# Patient Record
Sex: Female | Born: 1995 | Race: Black or African American | Hispanic: No | Marital: Single | Smoking: Never smoker
Health system: Southern US, Community
[De-identification: ages and names within clinical notes are randomized; demographics above are authoritative.]

## PROBLEM LIST (undated history)

## (undated) DIAGNOSIS — N809 Endometriosis, unspecified: Secondary | ICD-10-CM

## (undated) DIAGNOSIS — D649 Anemia, unspecified: Secondary | ICD-10-CM

## (undated) HISTORY — PX: OTHER SURGICAL HISTORY: SHX169

## (undated) HISTORY — PX: LAPAROSCOPY: SHX197

---

## 2017-11-11 ENCOUNTER — Encounter (HOSPITAL_COMMUNITY): Payer: Self-pay | Admitting: *Deleted

## 2017-11-11 ENCOUNTER — Other Ambulatory Visit: Payer: Self-pay

## 2017-11-11 ENCOUNTER — Inpatient Hospital Stay (HOSPITAL_COMMUNITY)
Admission: AD | Admit: 2017-11-11 | Discharge: 2017-11-11 | Disposition: A | Discharge status: Home / Self Care | Payer: Self-pay | Source: Ambulatory Visit | Attending: Obstetrics & Gynecology | Admitting: Obstetrics & Gynecology

## 2017-11-11 DIAGNOSIS — R102 Pelvic and perineal pain: Secondary | ICD-10-CM | POA: Insufficient documentation

## 2017-11-11 DIAGNOSIS — G8929 Other chronic pain: Secondary | ICD-10-CM | POA: Insufficient documentation

## 2017-11-11 HISTORY — DX: Anemia, unspecified: D64.9

## 2017-11-11 HISTORY — DX: Endometriosis, unspecified: N80.9

## 2017-11-11 LAB — CBC WITH DIFFERENTIAL/PLATELET
BASOS PCT: 1 %
Basophils Absolute: 0.1 10*3/uL (ref 0.0–0.1)
Eosinophils Absolute: 0.1 10*3/uL (ref 0.0–0.7)
Eosinophils Relative: 3 %
HEMATOCRIT: 34.4 % — AB (ref 36.0–46.0)
Hemoglobin: 11.7 g/dL — ABNORMAL LOW (ref 12.0–15.0)
Lymphocytes Relative: 42 %
Lymphs Abs: 2 10*3/uL (ref 0.7–4.0)
MCH: 27.7 pg (ref 26.0–34.0)
MCHC: 34 g/dL (ref 30.0–36.0)
MCV: 81.5 fL (ref 78.0–100.0)
MONO ABS: 0.2 10*3/uL (ref 0.1–1.0)
MONOS PCT: 5 %
Neutro Abs: 2.3 10*3/uL (ref 1.7–7.7)
Neutrophils Relative %: 49 %
Platelets: 194 10*3/uL (ref 150–400)
RBC: 4.22 MIL/uL (ref 3.87–5.11)
RDW: 14.1 % (ref 11.5–15.5)
WBC: 4.7 10*3/uL (ref 4.0–10.5)

## 2017-11-11 LAB — URINALYSIS, ROUTINE W REFLEX MICROSCOPIC
BILIRUBIN URINE: NEGATIVE
Bacteria, UA: NONE SEEN
GLUCOSE, UA: NEGATIVE mg/dL
KETONES UR: NEGATIVE mg/dL
Leukocytes, UA: NEGATIVE
NITRITE: NEGATIVE
PROTEIN: NEGATIVE mg/dL
Specific Gravity, Urine: 1.031 — ABNORMAL HIGH (ref 1.005–1.030)
pH: 5 (ref 5.0–8.0)

## 2017-11-11 LAB — POCT PREGNANCY, URINE: Preg Test, Ur: NEGATIVE

## 2017-11-11 MED ORDER — KETOROLAC TROMETHAMINE 60 MG/2ML IM SOLN
60.0000 mg | Freq: Once | INTRAMUSCULAR | Status: AC
  Administered 2017-11-11: 60 mg via INTRAMUSCULAR
  Filled 2017-11-11: qty 2

## 2017-11-11 NOTE — MAU Note (Signed)
Pain started last night in RLQ, sharp stabbing pains.  When woke up this morning, pain was on both rt and left side, left worse.  Had diagnostic lap last Dec, dx with endometriosis, ovarian cysts and pelvic congestion

## 2017-11-11 NOTE — MAU Provider Note (Signed)
History     CSN: [id number]  Arrival date and time: 11/11/17 4098   First Provider Initiated Contact with Patient 11/11/17 0902      Chief Complaint  Patient presents with  . Abdominal Pain   HPI [name] is a 22 y.o. G2P1011 who presents to MAU today with complaint of pelvic pain. The patient states a long history of pelvic pain following the birth of her first child. 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 She has also had multiple ovarian cysts rupture and cause her periodic pain. She feels that she may have had a cyst rupture last night causing this increase in pain today. She states LLQ pain is worse than RLQ. She states LMP in early August and a long history of irregular periods. She is not currently on birth control. She has tried Nexplanon and OCPs in the past without relief of her symptoms and states her last doctor took her off birth control to see if pain would improve. She is sexually active. She has been taking Tylenol, last dose 0700 today, and Ibuprofen, last dose yesterday, for pain with some relief. She denies N/V/D or constipation, fever, vaginal bleeding, discharge, or UTI symptoms. She rates pain at 6/10 currently.   OB History    Gravida  2   Para  1   Term  1   Preterm      AB  1   Living  1     SAB  1   TAB      Ectopic      Multiple      Live Births  1           Past Medical History:  Diagnosis Date  . Anemia   . Endometriosis     Past Surgical History:  Procedure Laterality Date  . LAPAROSCOPY    . left knee surgery      History reviewed. No pertinent family history.  Social History   Tobacco Use  . Smoking status: Never Smoker  . Smokeless tobacco: Never Used  Substance Use Topics  . Alcohol use: Never    Frequency: Never  . Drug use: Never    Allergies: No Known Allergies  No  medications prior to admission.    Review of Systems  Constitutional: Negative for fever.  Gastrointestinal: Negative for abdominal pain, constipation, diarrhea, nausea and vomiting.  Genitourinary: Positive for pelvic pain. Negative for dysuria, frequency, urgency, vaginal bleeding and vaginal discharge.   Physical Exam   Blood pressure 114/71, pulse 78, temperature 98.1 F (36.7 C), temperature source Oral, resp. rate 16, weight 47.1 kg, last menstrual period 09/26/2017, SpO2 100 %.  Physical Exam  Nursing note and vitals reviewed. Constitutional: She is oriented to person, place, and time. She appears well-developed and well-nourished. No distress.  HENT:  Head: Normocephalic and atraumatic.  Cardiovascular: Normal rate.  Respiratory: Effort normal.  GI: Soft. Bowel sounds are normal. She exhibits no distension and no mass. There is tenderness (mild tenderness to palpation of the lower abdomen more prominent in the LLQ). There is no rebound and no guarding.  Genitourinary: Uterus is not enlarged and not tender. Right adnexum displays no mass, no tenderness and no fullness. Left adnexum displays tenderness (mild to moderate). Left adnexum displays no mass. No bleeding in the vagina. No vaginal discharge found.  Neurological: She is alert and oriented  to person, place, and time.  Skin: Skin is warm and dry. No erythema.  Psychiatric: She has a normal mood and affect.     Results for orders placed or performed during the hospital encounter of 11/11/17 (from the past 24 hour(s))  Urinalysis, Routine w reflex microscopic     Status: Abnormal   Collection Time: 11/11/17  8:50 AM  Result Value Ref Range   Color, Urine YELLOW YELLOW   APPearance HAZY (A) CLEAR   Specific Gravity, Urine 1.031 (H) 1.005 - 1.030   pH 5.0 5.0 - 8.0   Glucose, UA NEGATIVE NEGATIVE mg/dL   Hgb urine dipstick SMALL (A) NEGATIVE   Bilirubin Urine NEGATIVE NEGATIVE   Ketones, ur NEGATIVE NEGATIVE mg/dL    Protein, ur NEGATIVE NEGATIVE mg/dL   Nitrite NEGATIVE NEGATIVE   Leukocytes, UA NEGATIVE NEGATIVE   RBC / HPF 0-5 0 - 5 RBC/hpf   WBC, UA 0-5 0 - 5 WBC/hpf   Bacteria, UA NONE SEEN NONE SEEN   Squamous Epithelial / LPF 6-10 0 - 5   Mucus PRESENT   Pregnancy, urine POC     Status: None   Collection Time: 11/11/17  9:07 AM  Result Value Ref Range   Preg Test, Ur NEGATIVE NEGATIVE  CBC with Differential/Platelet     Status: Abnormal   Collection Time: 11/11/17  9:45 AM  Result Value Ref Range   WBC 4.7 4.0 - 10.5 K/uL   RBC 4.22 3.87 - 5.11 MIL/uL   Hemoglobin 11.7 (L) 12.0 - 15.0 g/dL   HCT 54.034.4 (L) 98.136.0 - 19.146.0 %   MCV 81.5 78.0 - 100.0 fL   MCH 27.7 26.0 - 34.0 pg   MCHC 34.0 30.0 - 36.0 g/dL   RDW 47.814.1 29.511.5 - 62.115.5 %   Platelets 194 150 - 400 K/uL   Neutrophils Relative % 49 %   Neutro Abs 2.3 1.7 - 7.7 K/uL   Lymphocytes Relative 42 %   Lymphs Abs 2.0 0.7 - 4.0 K/uL   Monocytes Relative 5 %   Monocytes Absolute 0.2 0.1 - 1.0 K/uL   Eosinophils Relative 3 %   Eosinophils Absolute 0.1 0.0 - 0.7 K/uL   Basophils Relative 1 %   Basophils Absolute 0.1 0.0 - 0.1 K/uL    MAU Course  Procedures None  MDM UPT - negative UA, CBC today  60 mg Toradol IM given   Assessment and Plan  A: Chronic pelvic pain   P: Discharge home Continue Tylenol and Ibuprofen PRN for pain  Outpatient US ordered  Warning signs for worsening condition discussed Patient advised to follow-up with [facility] after US for further evaluation and management of chronic pelvic pain Patient may return to MAU as needed or if her condition were to change or worsen  Vonzella NippleJulie Fumiye Lubben, PA-C 11/11/2017, 10:01 AM

## 2017-11-11 NOTE — Discharge Instructions (Signed)
Pelvic Pain, Female °Pelvic pain is pain in your lower belly (abdomen), below your belly button and between your hips. The pain may start suddenly (acute), keep coming back (recurring), or last a long time (chronic). Pelvic pain that lasts longer than six months is considered chronic. There are many causes of pelvic pain. Sometimes the cause of your pelvic pain is not known. °Follow these instructions at home: °· Take over-the-counter and prescription medicines only as told by your doctor. °· Rest as told by your doctor. °· Do not have sex it if hurts. °· Keep a journal of your pelvic pain. Write down: °? When the pain started. °? Where the pain is located. °? What seems to make the pain better or worse, such as food or your menstrual cycle. °? Any symptoms you have along with the pain. °· Keep all follow-up visits as told by your doctor. This is important. °Contact a doctor if: °· Medicine does not help your pain. °· Your pain comes back. °· You have new symptoms. °· You have unusual vaginal discharge or bleeding. °· You have a fever or chills. °· You are having a hard time pooping (constipation). °· You have blood in your pee (urine) or poop (stool). °· Your pee smells bad. °· You feel weak or lightheaded. °Get help right away if: °· You have sudden pain that is very bad. °· Your pain continues to get worse. °· You have very bad pain and also have any of the following symptoms: °? A fever. °? Feeling stick to your stomach (nausea). °? Throwing up (vomiting). °? Being very sweaty. °· You pass out (lose consciousness). °This information is not intended to replace advice given to you by your health care provider. Make sure you discuss any questions you have with your health care provider. °Document Released: 07/28/2007 Document Revised: 03/05/2015 Document Reviewed: 11/29/2014 °[employer]vier Interactive Patient Education © 2018 [employer] ° °

## 2017-11-28 ENCOUNTER — Ambulatory Visit (HOSPITAL_COMMUNITY)
Admission: RE | Admit: 2017-11-28 | Discharge: 2017-11-28 | Disposition: A | Discharge status: Home / Self Care | Payer: Self-pay | Source: Ambulatory Visit | Attending: Medical | Admitting: Medical

## 2017-11-28 DIAGNOSIS — R102 Pelvic and perineal pain: Secondary | ICD-10-CM | POA: Insufficient documentation

## 2017-11-28 DIAGNOSIS — G8929 Other chronic pain: Secondary | ICD-10-CM | POA: Insufficient documentation

## 2017-12-02 ENCOUNTER — Encounter: Payer: Self-pay | Admitting: Family Medicine

## 2017-12-02 ENCOUNTER — Telehealth: Payer: Self-pay | Admitting: Family Medicine

## 2017-12-02 NOTE — Telephone Encounter (Signed)
Called patient to give her the appointment information. I was not able to leave a message. I will be sending her a letter about her appointment.

## 2017-12-05 ENCOUNTER — Encounter: Payer: Self-pay | Admitting: Family Medicine

## 2019-05-12 IMAGING — US US PELVIS COMPLETE TRANSABD/TRANSVAG
1 series · 15 of 25 positions shown · non-contrast
Comparison: None

CLINICAL DATA: Chronic pelvic pain. Patient has laparoscopy in
January 2017. Pelvic congestion syndrome/endometriosis. LMP
10/28/2017.

EXAM:
TRANSABDOMINAL AND TRANSVAGINAL ULTRASOUND OF PELVIS
TECHNIQUE: Both transabdominal and transvaginal ultrasound examinations of the
pelvis were performed. Transabdominal technique was performed for
global imaging of the pelvis including uterus, ovaries, adnexal
regions, and pelvic cul-de-sac. It was necessary to proceed with
endovaginal exam following the transabdominal exam to visualize the
endometrium and ovaries.

[Series 1: us pelvis complete transabd/transvag · 30 acquisitions, 15 frames shown]
[im 1/30]
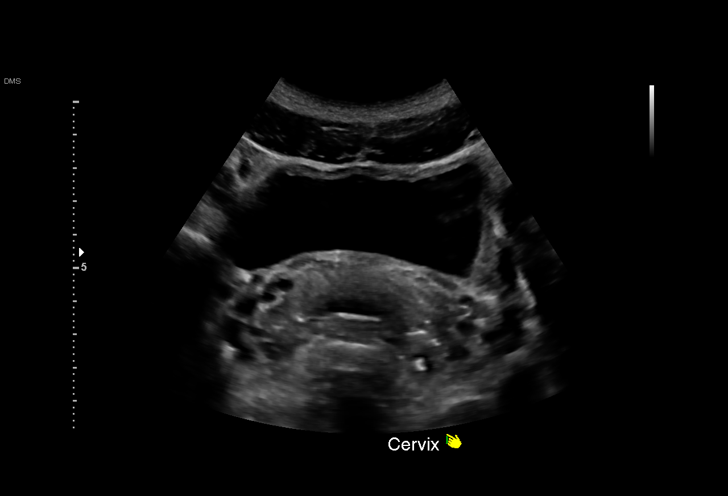
[im 3/30]
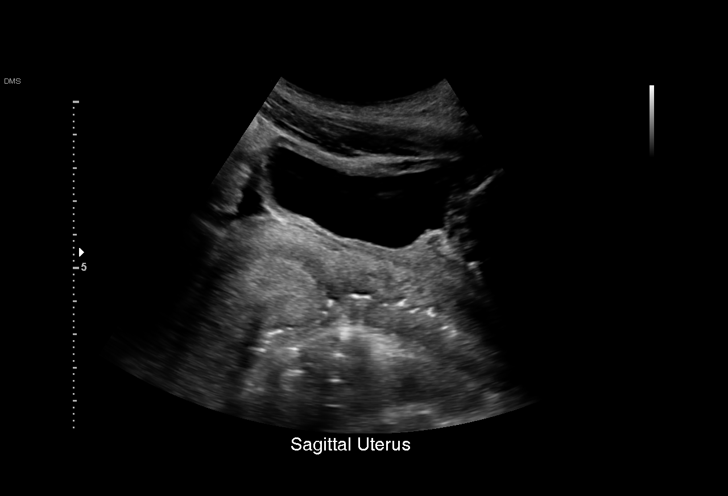
[im 5/30]
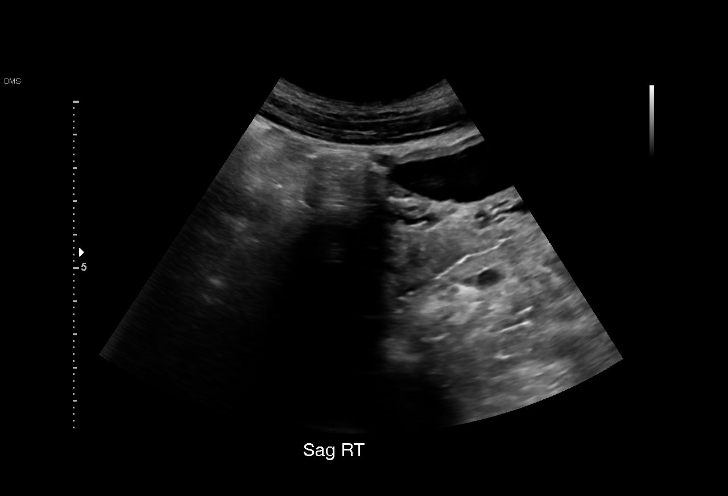
[im 7/30]
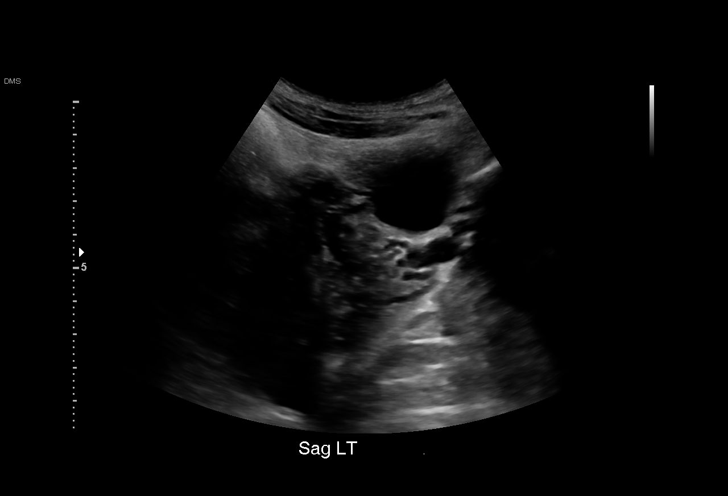
[im 9/30]
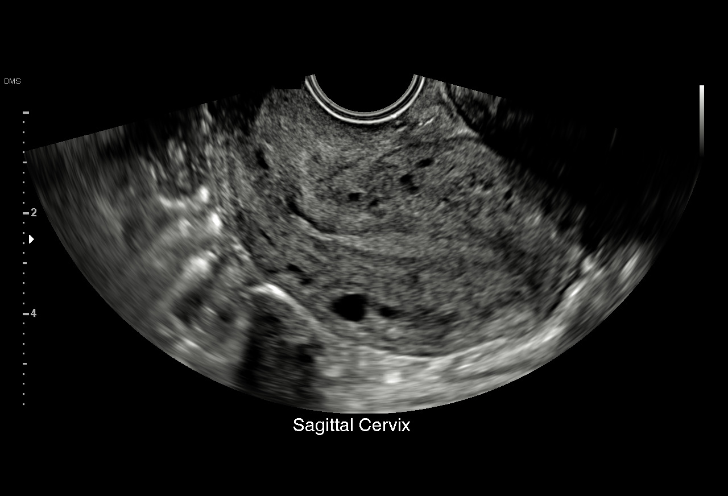
[im 11/30]
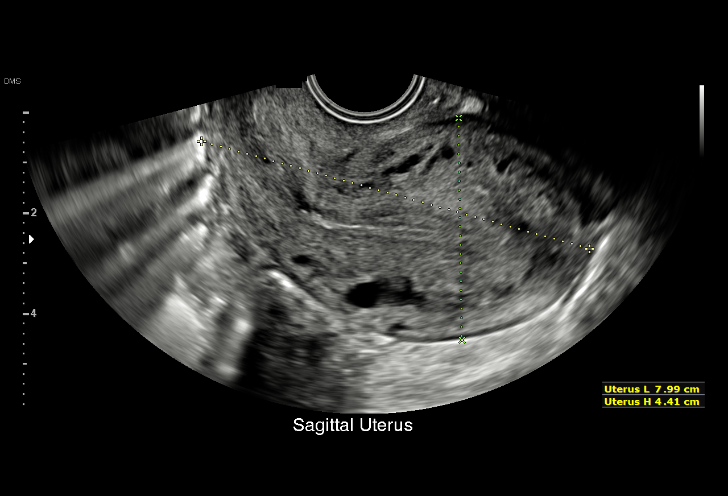
[im 13/30]
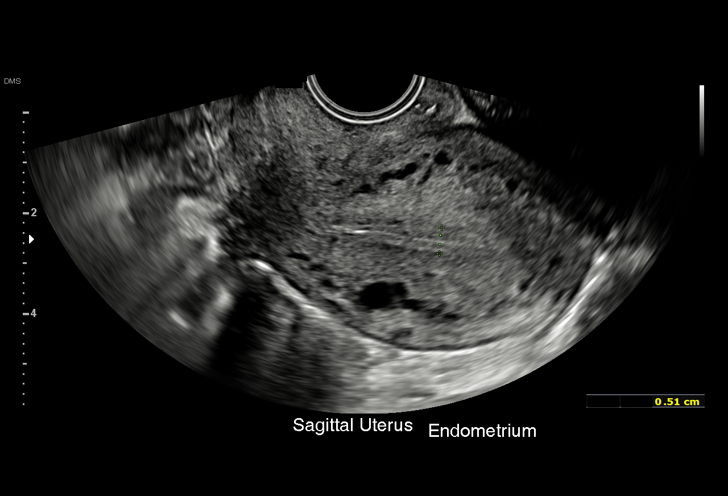
[im 15/30]
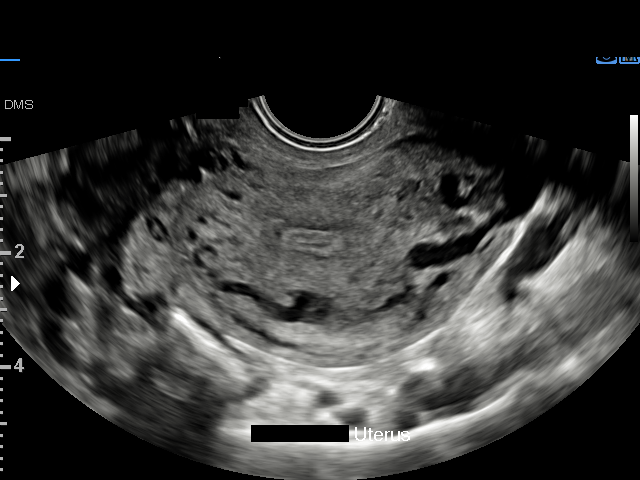
[im 17/30]
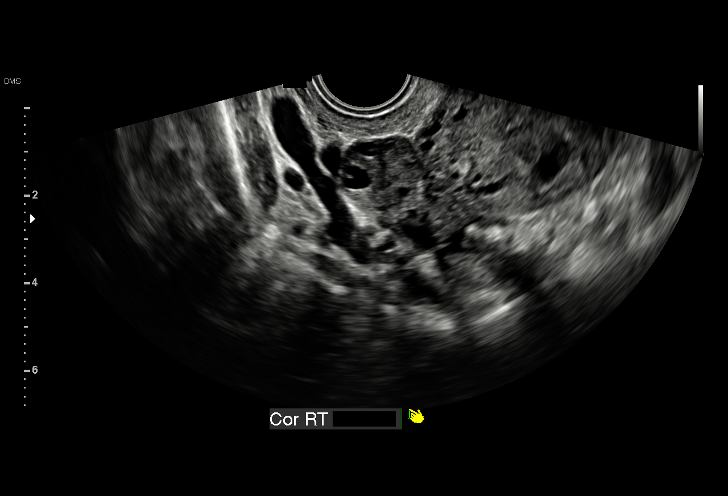
[im 19/30]
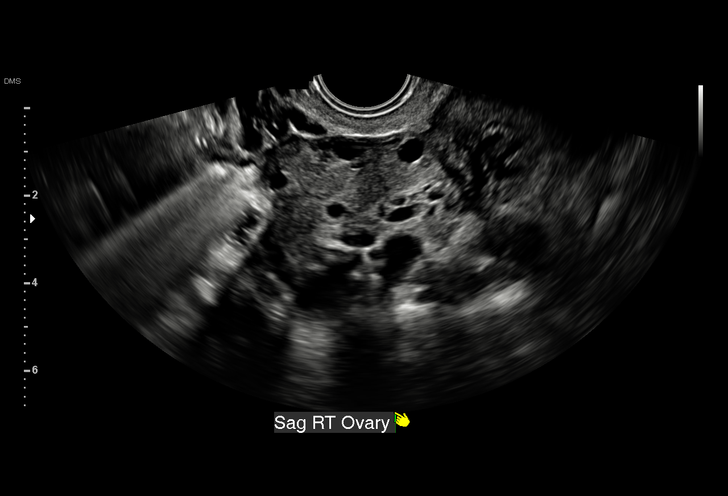
[im 21/30]
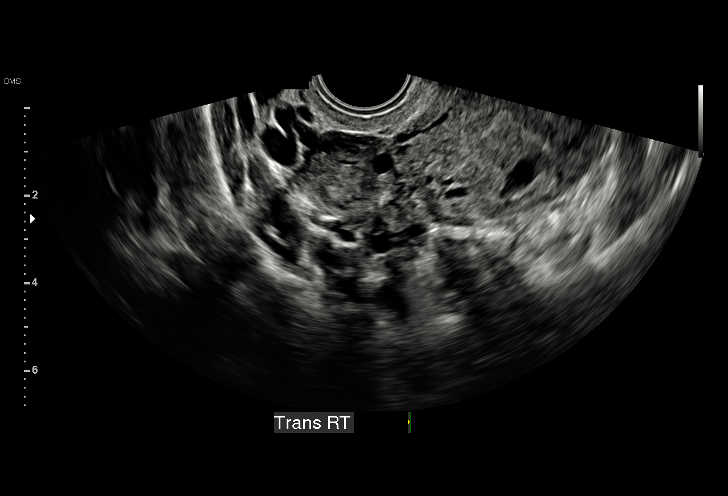
[im 23/30]
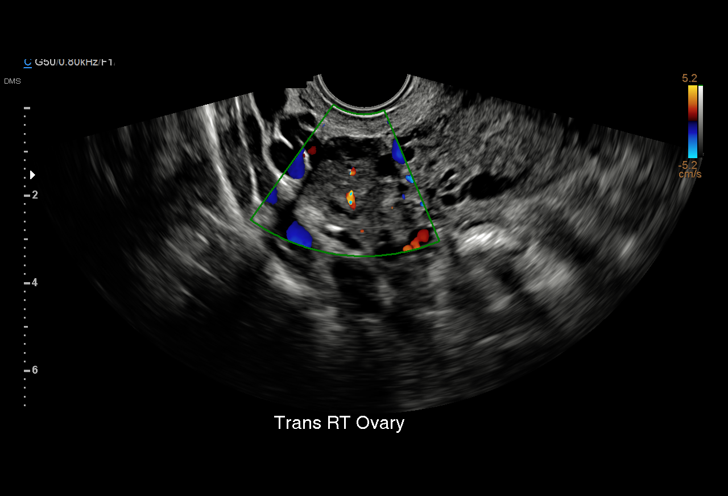
[im 25/30]
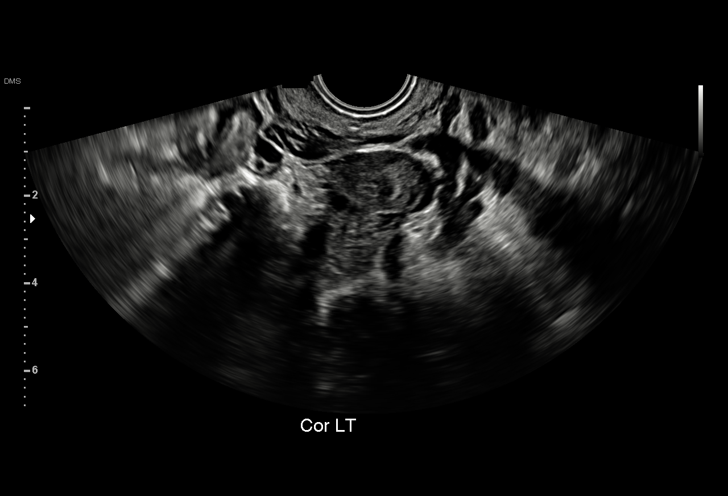
[im 27/30]
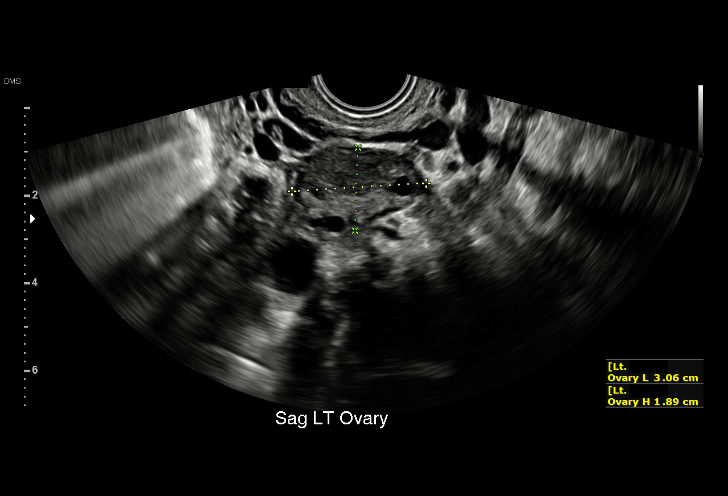
[im 30/30]
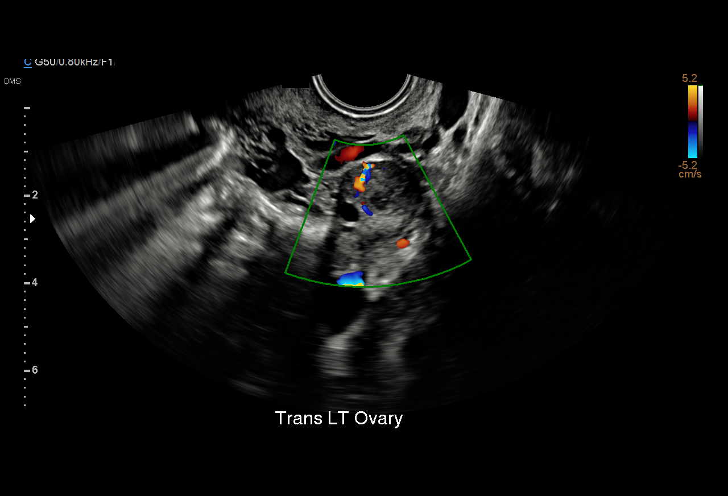

[15 of 25 positions shown; findings below may reference images not displayed]

FINDINGS: Uterus

Measurements: 8.0 x 4.4 x 6.4 centimeters. No fibroids or other mass
visualized. Uterus is retroflexed. There is prominent vascularity
within the myometrium.

Endometrium

Thickness: 5.1 millimeters.  No focal abnormality visualized.

Right ovary

Measurements: 4.1 x 2.1 x 2.3 centimeters. Normal appearance/no
adnexal mass.

Left ovary

Measurements: 3.1 x 1.9 x 2.0 centimeters. Normal appearance/no
adnexal mass.

Other findings

No abnormal free fluid.
IMPRESSION: 1. Prominent vascularity within the uterus.
2. No adnexal mass or endometrial abnormality.
